# Patient Record
Sex: Male | Born: 1980 | Race: Black or African American | Hispanic: No | Marital: Single | Smoking: Never smoker
Health system: Southern US, Community
[De-identification: ages and names within clinical notes are randomized; demographics above are authoritative.]

---

## 2015-04-15 ENCOUNTER — Emergency Department (HOSPITAL_COMMUNITY)
Admission: EM | Admit: 2015-04-15 | Discharge: 2015-04-15 | Disposition: A | Discharge status: Home / Self Care | Payer: Self-pay | Attending: Emergency Medicine | Admitting: Emergency Medicine

## 2015-04-15 ENCOUNTER — Encounter (HOSPITAL_COMMUNITY): Payer: Self-pay | Admitting: Emergency Medicine

## 2015-04-15 ENCOUNTER — Emergency Department (HOSPITAL_COMMUNITY): Payer: Self-pay

## 2015-04-15 DIAGNOSIS — R42 Dizziness and giddiness: Secondary | ICD-10-CM | POA: Insufficient documentation

## 2015-04-15 DIAGNOSIS — R945 Abnormal results of liver function studies: Secondary | ICD-10-CM

## 2015-04-15 DIAGNOSIS — R1011 Right upper quadrant pain: Secondary | ICD-10-CM | POA: Insufficient documentation

## 2015-04-15 DIAGNOSIS — R7989 Other specified abnormal findings of blood chemistry: Secondary | ICD-10-CM | POA: Insufficient documentation

## 2015-04-15 DIAGNOSIS — R1012 Left upper quadrant pain: Secondary | ICD-10-CM | POA: Insufficient documentation

## 2015-04-15 DIAGNOSIS — J00 Acute nasopharyngitis [common cold]: Secondary | ICD-10-CM | POA: Insufficient documentation

## 2015-04-15 DIAGNOSIS — R197 Diarrhea, unspecified: Secondary | ICD-10-CM | POA: Insufficient documentation

## 2015-04-15 LAB — COMPREHENSIVE METABOLIC PANEL
ALT: 70 U/L — ABNORMAL HIGH (ref 17–63)
ANION GAP: 8 (ref 5–15)
AST: 71 U/L — ABNORMAL HIGH (ref 15–41)
Albumin: 4.3 g/dL (ref 3.5–5.0)
Alkaline Phosphatase: 80 U/L (ref 38–126)
BILIRUBIN TOTAL: 1.1 mg/dL (ref 0.3–1.2)
BUN: 10 mg/dL (ref 6–20)
CO2: 26 mmol/L (ref 22–32)
CREATININE: 0.97 mg/dL (ref 0.61–1.24)
Calcium: 9.2 mg/dL (ref 8.9–10.3)
Chloride: 105 mmol/L (ref 101–111)
GFR calc Af Amer: >60 mL/min (ref >60)
GLUCOSE: 120 mg/dL — AB (ref 65–99)
POTASSIUM: 4.1 mmol/L (ref 3.5–5.1)
Sodium: 139 mmol/L (ref 135–145)
Total Protein: 7.5 g/dL (ref 6.5–8.1)

## 2015-04-15 LAB — URINALYSIS, ROUTINE W REFLEX MICROSCOPIC
Bilirubin Urine: NEGATIVE
GLUCOSE, UA: NEGATIVE mg/dL
Hgb urine dipstick: NEGATIVE
Ketones, ur: NEGATIVE mg/dL
Nitrite: NEGATIVE
PH: 6 (ref 5.0–8.0)
PROTEIN: NEGATIVE mg/dL
SPECIFIC GRAVITY, URINE: 1.028 (ref 1.005–1.030)
Urobilinogen, UA: 0.2 mg/dL (ref 0.0–1.0)

## 2015-04-15 LAB — CBC WITH DIFFERENTIAL/PLATELET
Basophils Absolute: 0 10*3/uL (ref 0.0–0.1)
Basophils Relative: 0 % (ref 0–1)
Eosinophils Absolute: 0.1 10*3/uL (ref 0.0–0.7)
Eosinophils Relative: 1 % (ref 0–5)
HCT: 46.6 % (ref 39.0–52.0)
Hemoglobin: 15.5 g/dL (ref 13.0–17.0)
LYMPHS PCT: 11 % — AB (ref 12–46)
Lymphs Abs: 1.1 10*3/uL (ref 0.7–4.0)
MCH: 28.7 pg (ref 26.0–34.0)
MCHC: 33.3 g/dL (ref 30.0–36.0)
MCV: 86.1 fL (ref 78.0–100.0)
Monocytes Absolute: 1 10*3/uL (ref 0.1–1.0)
Monocytes Relative: 10 % (ref 3–12)
NEUTROS ABS: 8 10*3/uL — AB (ref 1.7–7.7)
Neutrophils Relative %: 78 % — ABNORMAL HIGH (ref 43–77)
Platelets: 280 10*3/uL (ref 150–400)
RBC: 5.41 MIL/uL (ref 4.22–5.81)
RDW: 14 % (ref 11.5–15.5)
WBC: 10.3 10*3/uL (ref 4.0–10.5)

## 2015-04-15 LAB — URINE MICROSCOPIC-ADD ON

## 2015-04-15 LAB — LIPASE, BLOOD: Lipase: 14 U/L — ABNORMAL LOW (ref 22–51)

## 2015-04-15 MED ORDER — PROMETHAZINE HCL 25 MG PO TABS: 25.0000 mg | ORAL_TABLET | Freq: Four times a day (QID) | ORAL | Status: AC | PRN

## 2015-04-15 MED ORDER — FAMOTIDINE 20 MG PO TABS: 20.0000 mg | ORAL_TABLET | Freq: Two times a day (BID) | ORAL | Status: AC

## 2015-04-15 NOTE — ED Notes (Signed)
Patient is alert and oriented x3.  He was given DC instructions and follow up visit instructions.  Patient gave verbal understanding.  He was DC ambulatory under his own power to home.  V/S stable.  He was not showing any signs of distress on DC 

## 2015-04-15 NOTE — ED Notes (Signed)
Ultrasound at bedside

## 2015-04-15 NOTE — Discharge Instructions (Signed)
Medication for stomach irritation and nausea. Your liver tests were slightly elevated. You will need primary care follow-up. Resource guide given.     Emergency Department Resource Guide 1) Find a Doctor and Pay Out of Pocket Although you won't have to find out who is covered by your insurance plan, it is a good idea to ask around and get recommendations. You will then need to call the office and see if the doctor you have chosen will accept you as a new patient and what types of options they offer for patients who are self-pay. Some doctors offer discounts or will set up payment plans for their patients who do not have insurance, but you will need to ask so you aren't surprised when you get to your appointment.  2) Contact Your Local Health Department Not all health departments have doctors that can see patients for sick visits, but many do, so it is worth a call to see if yours does. If you don't know where your local health department is, you can check in your phone book. The CDC also has a tool to help you locate your state's health department, and many state websites also have listings of all of their local health departments.  3) Find a Walk-in Clinic If your illness is not likely to be very severe or complicated, you may want to try a walk in clinic. These are popping up all over the country in pharmacies, drugstores, and shopping centers. They're usually staffed by nurse practitioners or physician assistants that have been trained to treat common illnesses and complaints. They're usually fairly quick and inexpensive. However, if you have serious medical issues or chronic medical problems, these are probably not your best option.  No Primary Care Doctor: - Call Health Connect at  [phone] - they can help you locate a primary care doctor that  accepts your insurance, provides certain services, etc. - Physician Referral Service- [phone]  Chronic Pain Problems: Organization          Address  Phone   Notes  [facility]  [phone] Patients need to be referred by their primary care doctor.   Medication Assistance: Organization         Address  Phone   Notes  [facility] Medication [facility] [address] [place] [place], [place] [address] [phone] --Must be a resident of [facility] -- Must have NO insurance coverage whatsoever (no Medicaid/ Medicare, etc.) -- The pt. MUST have a primary care doctor that directs their care regularly and follows them in the community   [facility]  [phone]   [employer]  [phone]    Agencies that provide inexpensive medical care: Organization         Address  Phone   Notes  [facility]  [phone]   [facility]    [phone]   [facility] [address] [place], [place] [address] [phone]   [facility] [place] [address] [place] [place] [phone]   [facility]    [phone]   [facility]    [phone]   [facility]  [address], [place] Phone:  [phone], Fax:  [phone] Hours of Operation:  9 am - 6 pm, M-F.  Also accepts Medicaid/Medicare and self-pay.  [facility]  [address], [place] Phone: [phone])  [phone], Fax: [phone]. Hours of Operation:  8:30 am - 5:30 pm, M-F.  Also accepts Medicaid and self-pay.  [facility] [place] [address], [place] Phone: [phone]   [place], [place], [place] [phone], Ext. [phone] Mondays & Thursdays: 7-9 AM.  First 15 patients are seen on a first come, first serve basis.    [place] Providers:  Organization         Address  Phone   Notes  [facility] [address], [place] [phone] Also accepts self-pay patients.  [facility] [address], [place]  [phone]   [place], [address], [place] [phone]   [facility] [address] [place] [phone]   Lucianne Lei [address], [place]   [phone] Only accepts [place] Access [place] patients after they have their name applied to their card.   Self-Pay (no insurance) in [facility]:  Organization         Address  Phone   Notes  Sickle Cell Patients, [facility] [place] [phone]   [facility] [place] [phone]   [facility] [place]  [place], [place], [place] [phone]   [facility]/Dr. Osei-Bonsu  [address] [place] or [address], [place] [phone] Phone number for both [place] and [place] locations is the same.  [facility] [address] [place] [phone]   [facility] [address], [place] or [address] [phone]   [facility] [address] [place] [phone], phone; [phone], fax Sees patients 1st and 3rd Saturday of every month.  Must not qualify for public or private insurance (i.e. Medicaid, Medicare, [place] Health Choice, Veterans' Benefits)  Household income should be no more than 200% of the poverty level The clinic cannot treat you if you are pregnant or think you are pregnant  Sexually transmitted diseases are not treated at the clinic.    Dental Care: Organization         Address  Phone  Notes  [facility] [place] [phone] Accepts children up to age 23 who are enrolled in [place] or [place]; pregnant women with a Medicaid card; and  children who have applied for Medicaid or [place] Health Choice, but were declined, whose parents can pay a reduced fee at time of service.  [facility]  [address], [place] [phone] Accepts children up to age 22 who are enrolled in [place] or [place]; pregnant women with a Medicaid card; and children who have applied for Medicaid or [place] Health Choice, but were declined, whose parents can pay a reduced fee at time of service.  [facility]  [place] [phone] Patients are seen by appointment only. Walk-ins are not accepted. [place] will see patients 70 years of age and older. Monday - Tuesday (8am-5pm) Most Wednesdays (8:30-5pm) $30 per visit, cash only  [facility]  [address], [place] [phone] Patients are seen by appointment only. Walk-ins are not accepted. [facility] will see patients 18 years of age and older. °One Wednesday Evening (Monthly: Volunteer Based).  $30 per visit, cash only  °[facility]  [phone] for adults; Children under age 4, call Graduate Pediatric Dentistry at [phone]. Children aged 4-14, please call [phone] to request a pediatric application. ° Dental services are provided in all areas of dental care including fillings, crowns and bridges, complete and partial dentures, implants, gum treatment, root canals, and extractions. Preventive care is also provided. Treatment is provided to both adults and children. °Patients are selected via a lottery and there is often a waiting list. °  °[facility] [address], °[place] ° ([phone]) [phone] [email or web address] °  °[facility] [address], [place], [place] ([phone], Ext. [phone] Second and Fourth Thursday of each month, opens at 6:30 AM; Clinic ends at 9 AM.  Patients are seen on a first-come first-served  basis, and a limited number are seen during each clinic.  ° °[facility] ° [address], [place], [place] [phone]) [phone]   Eligibility Requirements °You must have lived in [place], [place], or [place] counties for at least the last three months. °  You cannot be eligible for state or federal sponsored healthcare insurance, including Veterans Administration, Medicaid, or Medicare. °  You generally cannot be eligible for healthcare insurance through your employer.  °  How to apply: °Eligibility screenings are held every Tuesday and Wednesday afternoon from 1:00 pm until 4:00 pm. You do not need an appointment for the interview!  °[facility] [address], [place], [place] [phone]   °[facility]  336-342-8273   °[facility]  [phone]   °[facility]  336-570-6415   ° °Behavioral Health Resources in the Community: °Intensive Outpatient Programs °Organization         Address  Phone  Notes  °[facility] [address], [place], [place] [phone]   °[facility] [address], [place], [place] 336-832-9800   °[facility] [address], [place], [place] ° 336-882-2125   °[facility] [address],  °[place], [place] 1-800-853-5163 or 336-641-4981   °Substance Abuse Resources °Organization         Address  Phone  Notes  °[facility]  336-882-2125   °[facility]  336-784-9470   °[facility]  336-285-9073   °[facility]  336-845-3988   °Residential & Outpatient Substance Abuse Program  1-800-659-3381   °Psychological Services °Organization         Address  Phone  Notes  °[facility]  336- 832-9600   °[facility]  336- 378-7881   °[facility] [address], [place] 1-800-853-5163 or 336-641-4981   ° °Mobile Crisis Teams °Organization          Address  Phone  Notes  °[facility], Mobile Crisis Care Unit  1-877-626-1772   °Assertive °[facility] ° [address] [place], [place] 336-834-9664   °Sharon DeEsch [address] °[place] 336-554-5454   ° °Self-Help/Support Groups °Organization         Address  Phone             Notes  °[facility] [place] - variety of support groups    336- 373-1402 Call for more information  °Narcotics Anonymous (NA), [facility] [address], °[place]  2 meetings at this location  ° °Residential Treatment Programs °Organization         Address  Phone  Notes  °[facility] [address],    °[place]  1-866-801-8205   °[facility] ° [address], [place], [place] [phone]   °[facility] [address], [place] 336-845-3988 Admissions: 8am-3pm M-F  °[facility] [address]    °[place], [place] 336-841-1104   °[facility] [address], [place], [place] 336-379-7146   °[facility] [address]  °[place], [place] 336-285-9073   °[facility] - Intensive Outpatient [address], [place], [place] 336-852-3033   °[facility] [address]  °[place], [place] 1-877-615-2722 or 336-784-9470   °[facility] ([facility]) [address] [place], [place] 336-227-7417 Accepts Medicaid  °[facility] [address]  °[place] 1-800-659-3381 Substance Abuse/Addiction Treatment  ° °[place] [facility] Resources °Organization         Address  Phone  Notes  °[facility]  (888) 581-9988   °Julie Brannon, PhD [address] [place], [place]   (336) 349-5553 or (336) 951-0000   °[facility]   [address] °[place], [place] (336) 349-4454   °[facility] [address], [place], [place] (336) 342-8316 Insurance/Medicaid/sponsorship  through [facility]  °[facility] [address]                                    [place], [place] (336) [phone] Therapy/tele-psych/case  °[facility] [address]  ° [place], [place] (336) 349-2233    °Dr. Arfeen  (336) 349-4544   °[facility] [place]  [employer] [facility] 1) [address], [place] °2) [address], [place] °3)  [address], [place] (336) 349-3220 °(336) 342-7768 ° °(336) 342-8140   °[place] Child Abuse [facility]line (336) 342-1394 or (336) 342-3537 (After Hours)    ° ° °

## 2015-04-15 NOTE — ED Provider Notes (Signed)
CSN: [id number]     Arrival date & time 04/15/15  1335 History   First MD Initiated Contact with Patient 04/15/15 1506     Chief Complaint  Patient presents with  . Abdominal Pain     (Consider location/radiation/quality/duration/timing/severity/associated sxs/prior Treatment) HPI..... Upper abdominal pain sent approximately 11 PM last night after eating meatballs and pasta with associated watery diarrhea 2. He felt shaky and lightheaded and cold. He went to urgent care center today but left prior to full workup. He is normally healthy. No medications.  No surgery. Minimal alcohol consumption.  He is feeling better now. Severity is mild to moderate. Nothing makes symptoms better or worse.  History reviewed. No pertinent past medical history. History reviewed. No pertinent past surgical history. History reviewed. No pertinent family history. History  Substance Use Topics  . Smoking status: Never Smoker   . Smokeless tobacco: Not on file  . Alcohol Use: No    Review of Systems  All other systems reviewed and are negative.     Allergies  Review of patient's allergies indicates no known allergies.  Home Medications   Prior to Admission medications   Medication Sig Start Date End Date Taking? Authorizing Provider  famotidine (PEPCID) 20 MG tablet Take 1 tablet (20 mg total) by mouth 2 (two) times daily. 04/15/15   Donnetta Hutching, MD  promethazine (PHENERGAN) 25 MG tablet Take 1 tablet (25 mg total) by mouth every 6 (six) hours as needed for nausea or vomiting. 04/15/15   Donnetta Hutching, MD   BP 131/66 mmHg  Pulse 72  Temp(Src) 98 F (36.7 C) (Oral)  Resp 18  SpO2 100% Physical Exam  Constitutional: He is oriented to person, place, and time. He appears well-developed and well-nourished.  HENT:  Head: Normocephalic and atraumatic.  Eyes: Conjunctivae and EOM are normal. Pupils are equal, round, and reactive to light.  Neck: Normal range of motion. Neck supple.  Cardiovascular:  Normal rate and regular rhythm.   Pulmonary/Chest: Effort normal and breath sounds normal.  Abdominal: Soft. Bowel sounds are normal.  Minimal upper abdominal tenderness right upper quadrant greater than left upper quadrant  Musculoskeletal: Normal range of motion.  Neurological: He is alert and oriented to person, place, and time.  Skin: Skin is warm and dry.  Psychiatric: He has a normal mood and affect. His behavior is normal.  Nursing note and vitals reviewed.   ED Course  Procedures (including critical care time) Labs Review Labs Reviewed  CBC WITH DIFFERENTIAL/PLATELET - Abnormal; Notable for the following:    Neutrophils Relative % 78 (*)    Neutro Abs 8.0 (*)    Lymphocytes Relative 11 (*)    All other components within normal limits  COMPREHENSIVE METABOLIC PANEL - Abnormal; Notable for the following:    Glucose, Bld 120 (*)    AST 71 (*)    ALT 70 (*)    All other components within normal limits  LIPASE, BLOOD - Abnormal; Notable for the following:    Lipase 14 (*)    All other components within normal limits  URINALYSIS, ROUTINE W REFLEX MICROSCOPIC (NOT AT [facility]) - Abnormal; Notable for the following:    Color, Urine AMBER (*)    Leukocytes, UA SMALL (*)    All other components within normal limits  URINE MICROSCOPIC-ADD ON - Abnormal; Notable for the following:    Casts HYALINE CASTS (*)    All other components within normal limits    Imaging Review US Abdomen Limited  Ruq  04/15/2015   CLINICAL DATA:  Abdominal pain since 11 pm, elevated LFTs  EXAM: US ABDOMEN LIMITED - RIGHT UPPER QUADRANT  COMPARISON:  None.  FINDINGS: Gallbladder:  No gallstones, gallbladder wall thickening, or pericholecystic fluid. Negative sonographic Murphy's sign.  Common bile duct:  Diameter: 4 mm  Liver:  6 x 7 x 8 mm echogenic lesion in the right hepatic lobe, likely reflecting a benign hemangioma. Within normal limits for parenchymal echogenicity.  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  Otherwise negative right upper quadrant ultrasound.   Electronically Signed   By: Charline Bills M.D.   On: 04/15/2015 18:37     EKG Interpretation None      MDM   Final diagnoses:  RUQ pain  Elevated liver function tests    No acute abdomen. Discussed elevated liver functions with patient and his significant other. Also discussed ultrasound results. Discharge medications Phenergan 25 mg and Pepcid. He will get primary care follow-up.    Donnetta Hutching, MD 04/15/15 2134

## 2015-04-15 NOTE — ED Notes (Signed)
Pt c/o mid abd pain since yesterday. Denies NVD.

## 2016-08-04 ENCOUNTER — Encounter (HOSPITAL_COMMUNITY): Payer: Self-pay

## 2016-08-04 ENCOUNTER — Ambulatory Visit (HOSPITAL_COMMUNITY)
Admission: EM | Admit: 2016-08-04 | Discharge: 2016-08-04 | Disposition: A | Discharge status: Home / Self Care | Payer: Self-pay | Attending: Internal Medicine | Admitting: Internal Medicine

## 2016-08-04 DIAGNOSIS — H1033 Unspecified acute conjunctivitis, bilateral: Secondary | ICD-10-CM

## 2016-08-04 MED ORDER — GENTAMICIN SULFATE 0.3 % OP SOLN: 2.0000 [drp] | OPHTHALMIC | 0 refills | Status: DC

## 2016-08-04 NOTE — ED Provider Notes (Signed)
CSN: [id number]     Arrival date & time 08/04/16  1824 History   First MD Initiated Contact with Patient 08/04/16 1921     Chief Complaint  Patient presents with  . Conjunctivitis   (Consider location/radiation/quality/duration/timing/severity/associated sxs/prior Treatment) HPI NP 35 Y/O MALE WITH DRAINING, RED, ITCHY EYES FOR 4 DAYS. NOT GETTING BETTER.TX AT HOME WITH APPLE VINEGAR. PAIN SCORE 2 History reviewed. No pertinent past medical history. History reviewed. No pertinent surgical history. History reviewed. No pertinent family history. Social History  Substance Use Topics  . Smoking status: Never Smoker  . Smokeless tobacco: Never Used  . Alcohol use No    Review of Systems  Denies: HEADACHE, NAUSEA, ABDOMINAL PAIN, CHEST PAIN, CONGESTION, DYSURIA, SHORTNESS OF BREATH  Allergies  Review of patient's allergies indicates no known allergies.  Home Medications   Prior to Admission medications   Medication Sig Start Date End Date Taking? Authorizing Provider  famotidine (PEPCID) 20 MG tablet Take 1 tablet (20 mg total) by mouth 2 (two) times daily. 04/15/15   Donnetta HutchingBrian Cook, MD  gentamicin (GARAMYCIN) 0.3 % ophthalmic solution Place 2 drops into both eyes every 4 (four) hours. 08/04/16 08/11/16  Tharon AquasFrank C Patrick, PA  promethazine (PHENERGAN) 25 MG tablet Take 1 tablet (25 mg total) by mouth every 6 (six) hours as needed for nausea or vomiting. 04/15/15   Donnetta HutchingBrian Cook, MD   Meds Ordered and Administered this Visit  Medications - No data to display  BP 140/99   Pulse 77   Temp 98.9 F (37.2 C) (Oral)   Resp 12   SpO2 100%  No data found.   Physical Exam NURSES NOTES AND VITAL SIGNS REVIEWED. CONSTITUTIONAL: Well developed, well nourished, no acute distress HEENT: normocephalic, atraumatic EYES: Conjunctiva RED AND INJECTED. DRAINAGE IN LASHES. PERIAURICULAR NODES PALPABLE.  NECK:normal ROM, supple, no adenopathy PULMONARY:No respiratory distress, normal  effort ABDOMINAL: Soft, ND, NT BS+, No CVAT MUSCULOSKELETAL: Normal ROM of all extremities,  SKIN: warm and dry without rash PSYCHIATRIC: Mood and affect, behavior are normal  Urgent Care Course   Clinical Course    Procedures (including critical care time)  Labs Review Labs Reviewed - No data to display  Imaging Review No results found.   Visual Acuity Review  Right Eye Distance: 20/25 Left Eye Distance: 20/20 Bilateral Distance:    Right Eye Near:   Left Eye Near:    Bilateral Near:         MDM   1. Acute bacterial conjunctivitis of both eyes     Patient is reassured that there are no issues that require transfer to higher level of care at this time or additional tests. Patient is advised to continue home symptomatic treatment. Patient is advised that if there are new or worsening symptoms to attend the emergency department, contact primary care provider, or return to UC. Instructions of care provided discharged home in stable condition.    THIS NOTE WAS GENERATED USING A VOICE RECOGNITION SOFTWARE PROGRAM. ALL REASONABLE EFFORTS  WERE MADE TO PROOFREAD THIS DOCUMENT FOR ACCURACY.  I have verbally reviewed the discharge instructions with the patient. A printed AVS was given to the patient.  All questions were answered prior to discharge.      Tharon AquasFrank C Patrick, PA 08/04/16 2050    Tharon AquasFrank C Patrick, [place] 08/04/16 2051

## 2016-08-04 NOTE — ED Triage Notes (Signed)
Pt here for bilateral pink eye started last Tuesday and progressed to both eyes. Complains of sensitivity to light, itching, watery eyes. Has been using OTC pink eye relief drops without improvement

## 2016-08-06 ENCOUNTER — Telehealth (HOSPITAL_COMMUNITY): Payer: Self-pay | Admitting: Emergency Medicine

## 2016-08-06 ENCOUNTER — Telehealth (HOSPITAL_COMMUNITY): Payer: Self-pay | Admitting: *Deleted

## 2016-08-06 NOTE — Telephone Encounter (Signed)
Rec'd message from front staff stating that pt is still not getting any better... Seen here on 10/14 for conjunctivitis... Taking meds w/no relief and would like to know what to do.   Called pt at [phone] and pt's fiancee picked up.   States she called on his behalf... Adv her that I need to speak to him or have consent from him to speak to her  Steffanie RainwaterFiancee verb understanding and will have pt call us.

## 2016-08-10 ENCOUNTER — Ambulatory Visit (HOSPITAL_COMMUNITY)
Admission: EM | Admit: 2016-08-10 | Discharge: 2016-08-10 | Disposition: A | Discharge status: Home / Self Care | Payer: Self-pay | Attending: Family Medicine | Admitting: Family Medicine

## 2016-08-10 ENCOUNTER — Encounter (HOSPITAL_COMMUNITY): Payer: Self-pay | Admitting: Emergency Medicine

## 2016-08-10 DIAGNOSIS — H10213 Acute toxic conjunctivitis, bilateral: Secondary | ICD-10-CM

## 2016-08-10 MED ORDER — TOBRAMYCIN-DEXAMETHASONE 0.3-0.1 % OP SUSP: 1.0000 [drp] | OPHTHALMIC | 0 refills | Status: AC

## 2016-08-10 NOTE — ED Provider Notes (Signed)
[facility]    CSN: [id number] Arrival date & time: 08/10/16  1124     History   Chief Complaint Chief Complaint  Patient presents with  . Conjunctivitis    HPI Miguel SellersJoe Wells is a 35 y.o. male.   This is a 35 year old man who comes in with bilateral eye injection unresponsive to gentamicin ophthalmic solution.  The patient works in 2 different jobs. 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 In the other job at [employer], patient hopes people with their mechanical problems including battery issues. He recalls helping some with a battery about a week ago and might of gotten some battery liquid in his eye.  The problem began in his left eye and now is in his right eye. His girlfriend also had some problems with her left eye but they thought it was mascara.  Patient's had swollen glands bilaterally anterior to the tragus of his ears.      History reviewed. No pertinent past medical history.  There are no active problems to display for this patient.   History reviewed. No pertinent surgical history.     Home Medications    Prior to Admission medications   Medication Sig Start Date End Date Taking? Authorizing Provider  gentamicin (GARAMYCIN) 0.3 % ophthalmic solution Place 2 drops into both eyes every 4 (four) hours. 08/04/16 08/11/16 Yes Tharon AquasFrank C Patrick, PA  famotidine (PEPCID) 20 MG tablet Take 1 tablet (20 mg total) by mouth 2 (two) times daily. 04/15/15   Donnetta HutchingBrian Cook, MD  promethazine (PHENERGAN) 25 MG tablet Take 1 tablet (25 mg total) by mouth every 6 (six) hours as needed for nausea or vomiting. 04/15/15   Donnetta HutchingBrian Cook, MD    Family History History reviewed. No pertinent family history.  Social History Social History  Substance Use Topics  . Smoking status: Never Smoker  . Smokeless tobacco: Never Used  . Alcohol use No     Allergies   Review of patient's allergies indicates no known allergies.   Review of Systems Review of Systems  Constitutional:  Negative.   HENT: Negative.   Eyes: Positive for photophobia, pain, discharge, redness and itching. Negative for visual disturbance.  Musculoskeletal: Negative.      Physical Exam Triage Vital Signs ED Triage Vitals  Enc Vitals Group     BP 08/10/16 1153 151/76     Pulse Rate 08/10/16 1153 81     Resp 08/10/16 1153 14     Temp 08/10/16 1153 98.3 F (36.8 C)     Temp Source 08/10/16 1153 Oral     SpO2 08/10/16 1153 100 %     Weight --      Height --      Head Circumference --      Peak Flow --      Pain Score 08/10/16 1203 3     Pain Loc --      Pain Edu? --      Excl. in GC? --    No data found.   Updated Vital Signs BP 151/76 (BP Location: Left Arm)   Pulse 81   Temp 98.3 F (36.8 C) (Oral)   Resp 14   SpO2 100%      Physical Exam  Constitutional: He appears well-developed and well-nourished.  HENT:  Head: Normocephalic.  Right Ear: External ear normal.  Left Ear: External ear normal.  Mouth/Throat: Oropharynx is clear and moist.  Eyes: Pupils are equal, round, and reactive to light. Right eye  exhibits no discharge. Left eye exhibits no discharge.    Of the conjunctiva are intensely red and the lids are mildly swollen. Pupils are equal and reactive to light. Fundi are normal.  Pulmonary/Chest: Effort normal.  Musculoskeletal: Normal range of motion.  Nursing note and vitals reviewed.    UC Treatments / Results  Labs (all labs ordered are listed, but only abnormal results are displayed) Labs Reviewed - No data to display  EKG  EKG Interpretation None       Radiology No results found.  Procedures Procedures (including critical care time)  Medications Ordered in UC Medications - No data to display   Initial Impression / Assessment and Plan / UC Course  I have reviewed the triage vital signs and the nursing notes.  Pertinent labs & imaging results that were available during my care of the patient were reviewed by me and considered in my  medical decision making (see chart for details).  Clinical Course    Final Clinical Impressions(s) / UC Diagnoses   Final diagnoses:  None    New Prescriptions New Prescriptions   No medications on file     Elvina Sidle, MD 08/10/16 1225

## 2016-08-10 NOTE — ED Triage Notes (Signed)
Here for persistent bilateral pink eye   Seen here on 10/14 and was given Gentamycin eye drops w/no relief.   Sx today include: irritation, redness, watery, blurred vision  Denies inj/trauma  A&O x4... NAD

## 2016-08-10 NOTE — Discharge Instructions (Signed)
Wash your pillowcase.  If you're not better by Monday, I want you to see Dr. Ernesto Rutherfordobert [address]at [address] across from the hospital parking deck

## 2018-05-20 ENCOUNTER — Encounter (HOSPITAL_COMMUNITY): Payer: Self-pay

## 2018-05-20 ENCOUNTER — Ambulatory Visit (INDEPENDENT_AMBULATORY_CARE_PROVIDER_SITE_OTHER): Payer: BLUE CROSS/BLUE SHIELD

## 2018-05-20 ENCOUNTER — Ambulatory Visit (HOSPITAL_COMMUNITY)
Admission: EM | Admit: 2018-05-20 | Discharge: 2018-05-20 | Disposition: A | Discharge status: Home / Self Care | Payer: BLUE CROSS/BLUE SHIELD | Attending: Internal Medicine | Admitting: Internal Medicine

## 2018-05-20 DIAGNOSIS — M545 Low back pain, unspecified: Secondary | ICD-10-CM

## 2018-05-20 MED ORDER — MELOXICAM 7.5 MG PO TABS: 7.5000 mg | ORAL_TABLET | Freq: Every day | ORAL | 0 refills | Status: AC

## 2018-05-20 NOTE — ED Triage Notes (Signed)
Pt presents with lower back pain. 

## 2018-05-20 NOTE — Discharge Instructions (Addendum)
It was nice meeting you!!  Your xray was negative for fracture or anything worrisome.  We will try a round of anti inflammatories for the back pain.  If you are not seeing any improvement in the next few weeks you may want to follow up with an orthopedic.  You may benefit from a steroid injection.  If your not getting any relief from the 7.5 mg once a day, you can take it twice a day.

## 2018-05-20 NOTE — ED Provider Notes (Signed)
[facility]    CSN: [id number] Arrival date & time: 05/20/18  1135     History   Chief Complaint Chief Complaint  Patient presents with  . Back Pain    HPI Miguel Wells is a healthy [age] y.o. male.    Back Pain  Location:  Lumbar spine Quality:  Aching and stiffness Stiffness is present:  All day (worse with standing, bending and stooping. ) Radiates to:  Does not radiate Pain severity:  Moderate Pain is:  Worse during the night Onset quality:  Gradual Duration:  2 weeks Timing:  Constant Progression:  Worsening Chronicity:  New Context: not falling, not jumping from heights, not lifting heavy objects, not MCA, not MVA, not occupational injury, not pedestrian accident, not physical stress, not recent illness, not recent injury and not twisting   Relieved by:  Nothing Worsened by:  Standing, lying down, bending and twisting Ineffective treatments:  Ibuprofen Associated symptoms: no abdominal pain, no abdominal swelling, no bladder incontinence, no bowel incontinence, no chest pain, no dysuria, no fever, no headaches, no leg pain, no numbness, no paresthesias, no pelvic pain, no perianal numbness, no tingling, no weakness and no weight loss   Risk factors: no hx of cancer, no hx of osteoporosis, no lack of exercise, not obese, no recent surgery, no steroid use and no vascular disease     Pt works at [employer] and has a very physical job. He is concerned because the pain is not getting better.   ROS per HPI   History reviewed. No pertinent past medical history.  There are no active problems to display for this patient.   History reviewed. No pertinent surgical history.     Home Medications    Prior to Admission medications   Medication Sig Start Date End Date Taking? Authorizing Provider  famotidine (PEPCID) 20 MG tablet Take 1 tablet (20 mg total) by mouth 2 (two) times daily. 04/15/15   Donnetta Hutchingook, Brian, MD  meloxicam (MOBIC) 7.5 MG tablet Take 1 tablet (7.5 mg  total) by mouth daily. 05/20/18   Dahlia ByesBast, Delailah Spieth A, NP  promethazine (PHENERGAN) 25 MG tablet Take 1 tablet (25 mg total) by mouth every 6 (six) hours as needed for nausea or vomiting. 04/15/15   Donnetta Hutchingook, Brian, MD  tobramycin-dexamethasone [facility](TOBRADEX) ophthalmic solution Place 1 drop into both eyes every 4 (four) hours while awake. 08/10/16   Elvina SidleLauenstein, Kurt, MD    Family History History reviewed. No pertinent family history.  Social History Social History   Tobacco Use  . Smoking status: Never Smoker  . Smokeless tobacco: Never Used  Substance Use Topics  . Alcohol use: No  . Drug use: No     Allergies   Patient has no known allergies.   Review of Systems Review of Systems  Constitutional: Negative for fever and weight loss.  Cardiovascular: Negative for chest pain.  Gastrointestinal: Negative for abdominal pain and bowel incontinence.  Genitourinary: Negative for bladder incontinence, dysuria and pelvic pain.  Musculoskeletal: Positive for back pain.  Neurological: Negative for tingling, weakness, numbness, headaches and paresthesias.     Physical Exam   Physical Exam  Constitutional: He is oriented to person, place, and time. He appears well-developed and well-nourished.  HENT:  Head: Normocephalic and atraumatic.  Neck: Normal range of motion.  Cardiovascular: Normal rate and regular rhythm.  Pulmonary/Chest: Effort normal and breath sounds normal.  Abdominal: Soft.  Musculoskeletal:  Mild tenderness to lower lumbar spine.  No erythema, ecchymosis, swelling, deformity.  Neurological: He is alert and oriented to person, place, and time.  Skin: Skin is warm and dry.  Psychiatric: He has a normal mood and affect.  Nursing note and vitals reviewed.       Triage Vital Signs ED Triage Vitals  Enc Vitals Group     BP 05/20/18 1156 (!) 146/100     Pulse Rate 05/20/18 1156 77     Resp 05/20/18 1156 20     Temp 05/20/18 1156 98.2 F (36.8 C)     Temp Source  05/20/18 1156 Oral     SpO2 05/20/18 1156 96 %     Weight --      Height --      Head Circumference --      Peak Flow --      Pain Score 05/20/18 1154 7     Pain Loc --      Pain Edu? --      Excl. in GC? --    No data found.  Updated Vital Signs BP (!) 146/100 (BP Location: Left Arm)   Pulse 77   Temp 98.2 F (36.8 C) (Oral)   Resp 20   SpO2 96%   Visual Acuity Right Eye Distance:   Left Eye Distance:   Bilateral Distance:    Right Eye Near:   Left Eye Near:    Bilateral Near:       UC Treatments / Results  Labs (all labs ordered are listed, but only abnormal results are displayed) Labs Reviewed - No data to display  EKG None  Radiology Dg Lumbar Spine Complete  Result Date: 05/20/2018 CLINICAL DATA:  Two weeks of low back pain. The patient does considerable bending in his work. No history of injury. EXAM: LUMBAR SPINE - COMPLETE 4+ VIEW COMPARISON:  None in PACs FINDINGS: The lumbar vertebral bodies are preserved in height. There is minimal narrowing of the L4-5 disc space. Elsewhere the disc space heights are well maintained. There is no spondylolisthesis. The facet joints are unremarkable. The spinous processes are intact. The pedicles and transverse processes are intact. The observed portions of the sacrum are normal. IMPRESSION: Minimal narrowing of the L4-5 disc space. No acute bony abnormality of the lumbar spine. Electronically Signed   By: David  [place] M.D.   On: 05/20/2018 13:09    Procedures Procedures (including critical care time)  Medications Ordered in UC Medications - No data to display  Initial Impression / Assessment and Plan / UC Course  I have reviewed the triage vital signs and the nursing notes.  Pertinent labs & imaging results that were available during my care of the patient were reviewed by me and considered in my medical decision making (see chart for details).     We will get a lumbar spine x-ray due to duration of symptoms and  increasing of pain.   Final Clinical Impressions(s) / UC Diagnoses   Final diagnoses:  Acute midline low back pain without sciatica     Discharge Instructions     It was nice meeting you!!  Your xray was negative for fracture or anything worrisome.  We will try a round of anti inflammatories for the back pain.  If you are not seeing any improvement in the next few weeks you may want to follow up with an orthopedic.  You may benefit from a steroid injection.  If your not getting any relief from the 7.5 mg once a day, you can take it twice a day.  ED Prescriptions    Medication Sig Dispense Auth. Provider   meloxicam (MOBIC) 7.5 MG tablet Take 1 tablet (7.5 mg total) by mouth daily. 30 tablet Dahlia Byes A, NP     Controlled Substance Prescriptions [place] Controlled Substance Registry consulted? Not Applicable  Janace Aris, NP 05/20/18 1502

## 2019-09-02 IMAGING — DX DG LUMBAR SPINE COMPLETE 4+V
5 series · 5 of 5 positions shown · non-contrast
Comparison: None in PACs

CLINICAL DATA: Two weeks of low back pain. The patient does
considerable bending in his work. No history of injury.

EXAM:
LUMBAR SPINE - COMPLETE 4+ VIEW

[l-spine ap]
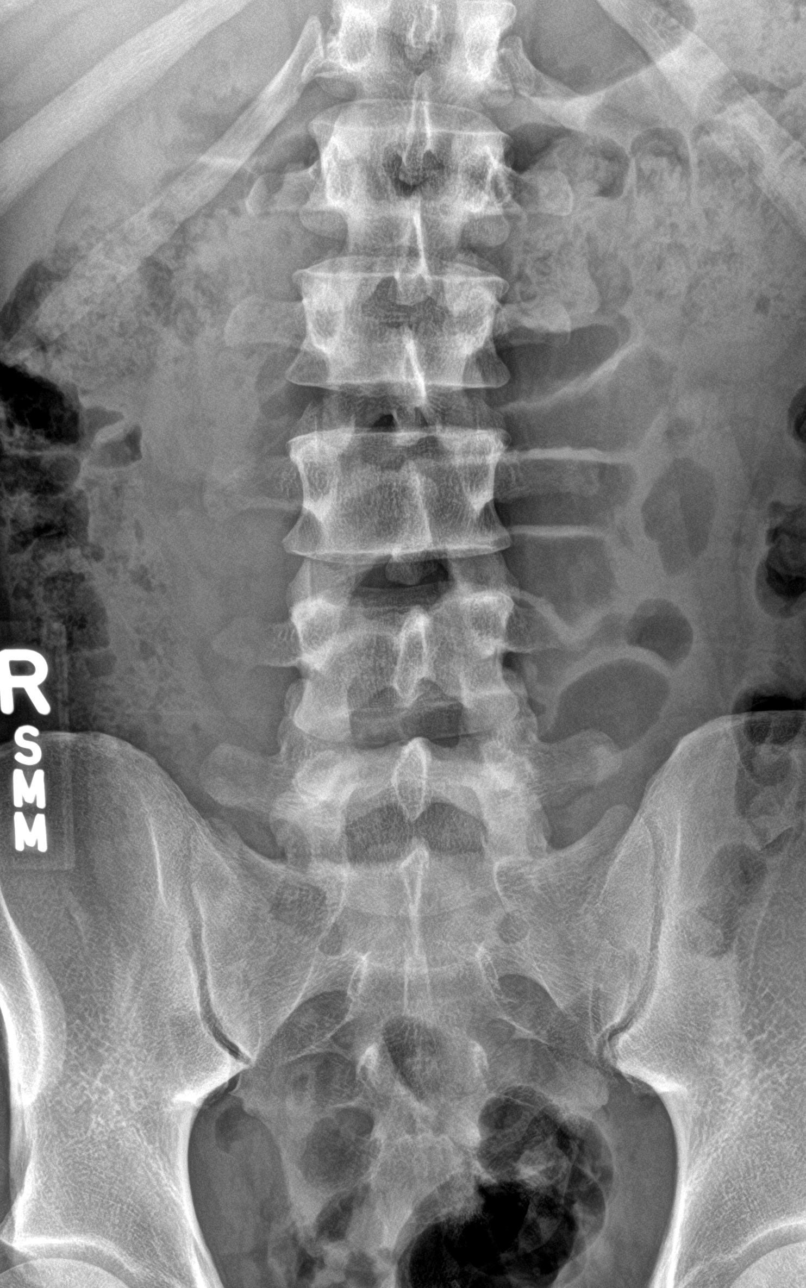

[l-spine obl (1 of 2)]
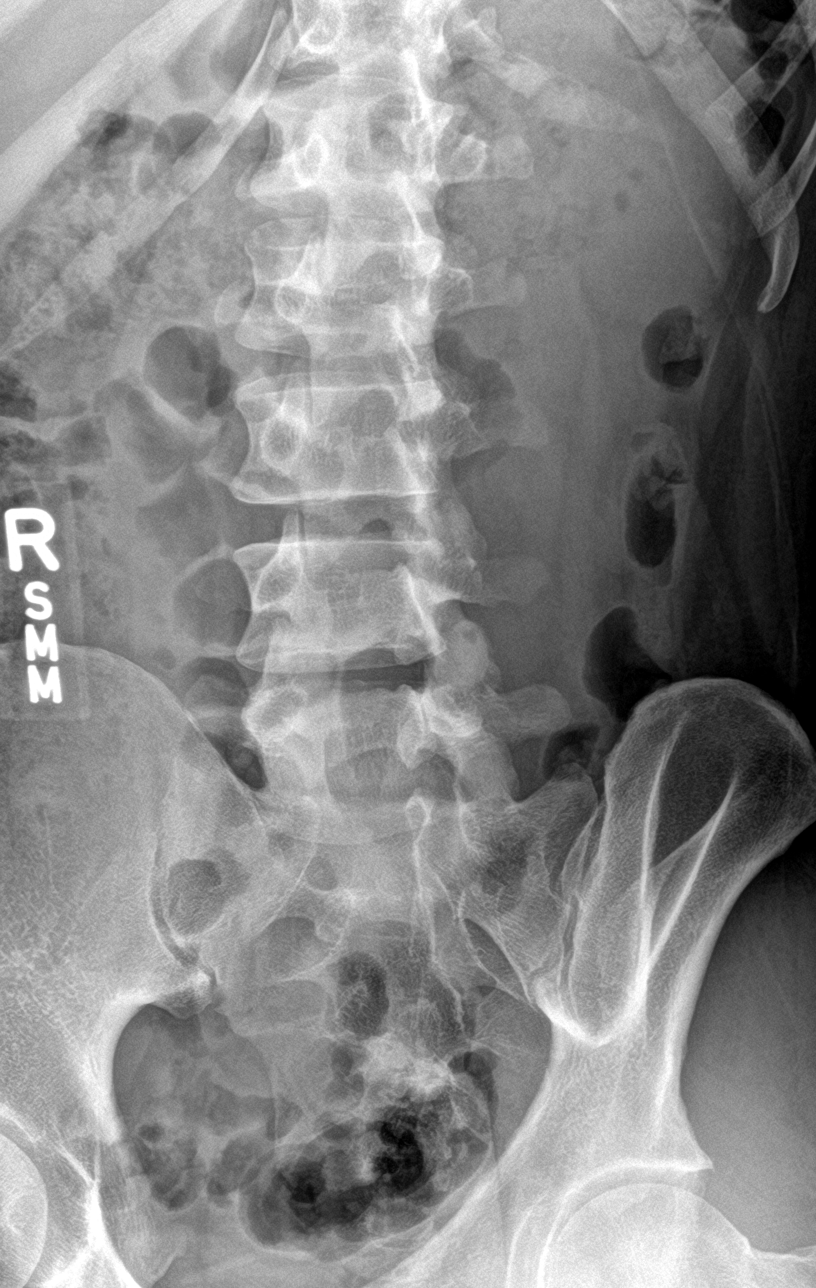

[l-spine obl (2 of 2)]
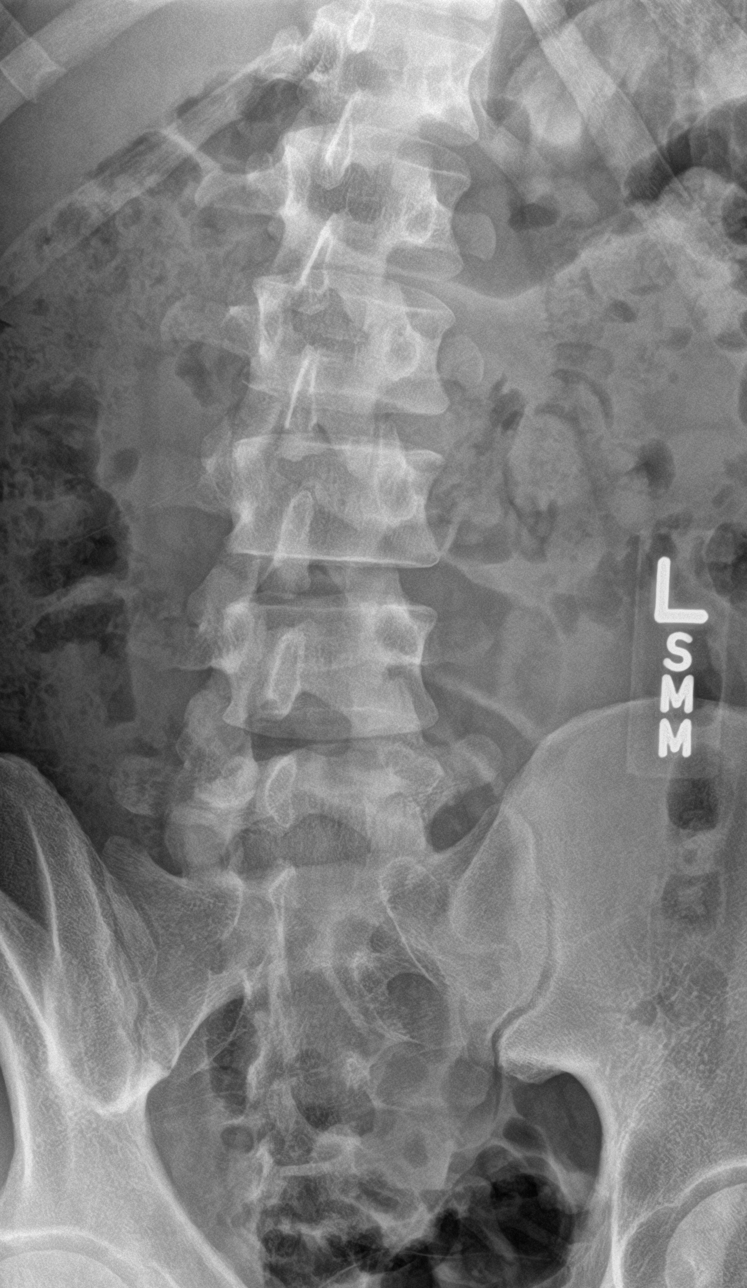

[l-spine lat]
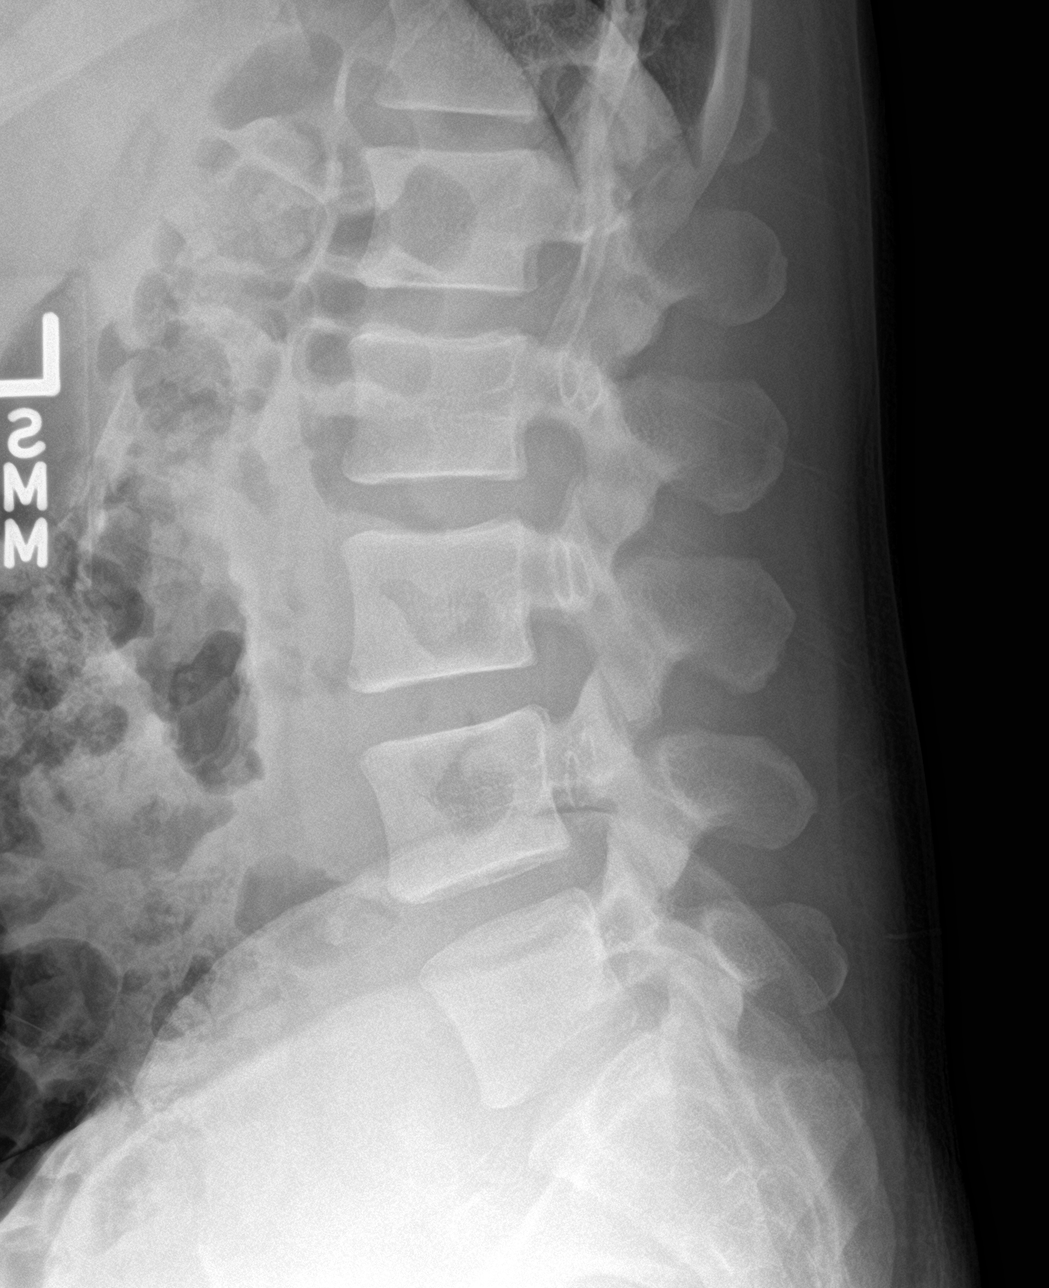

[l-spine spot]
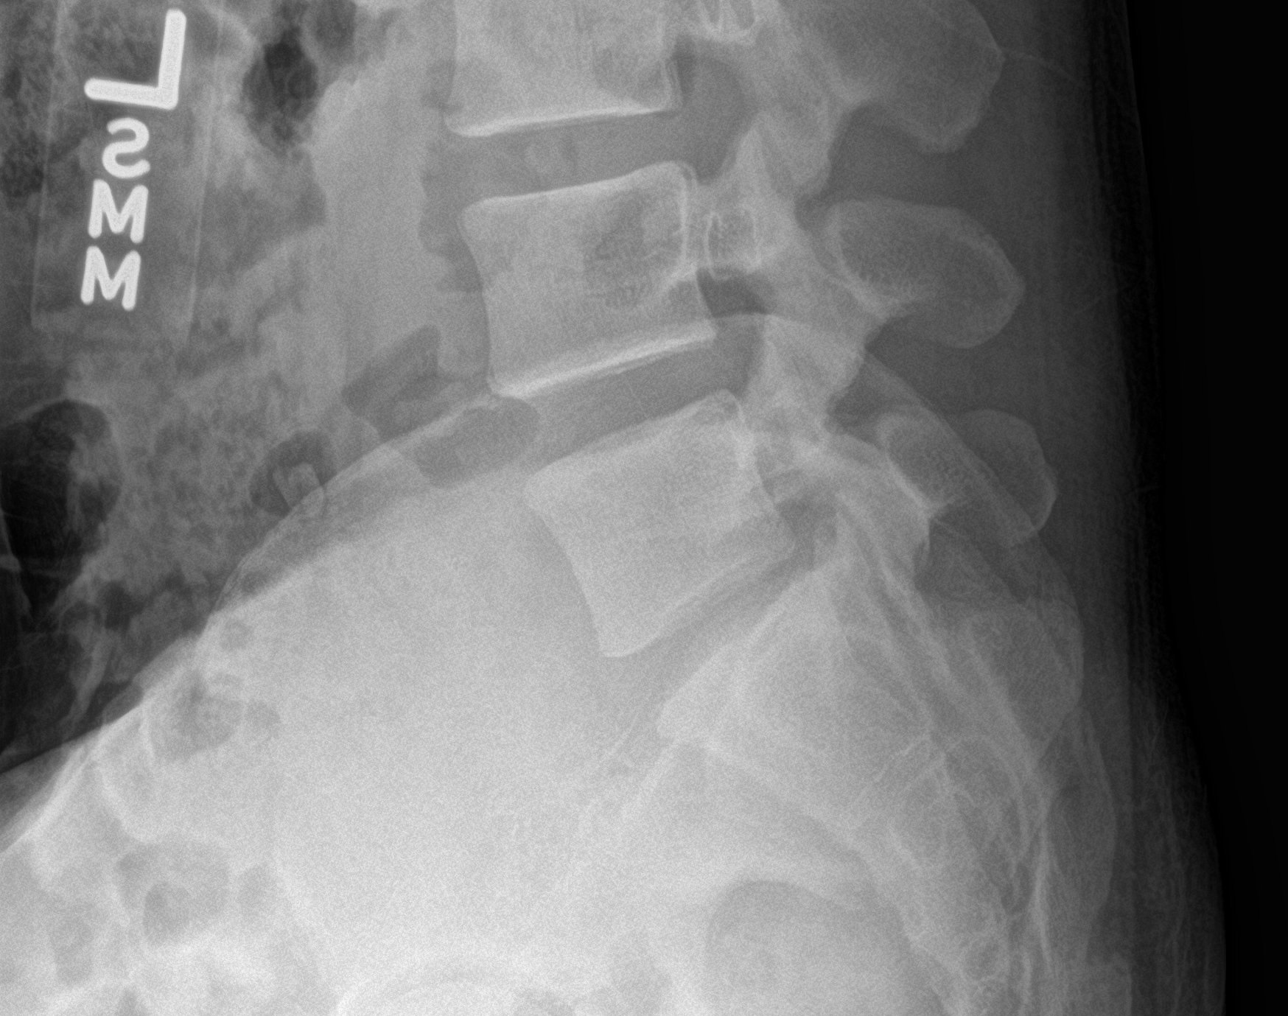

[5 of 5 positions shown; findings below may reference images not displayed]

FINDINGS: The lumbar vertebral bodies are preserved in height. There is
minimal narrowing of the L4-5 disc space. Elsewhere the disc space
heights are well maintained. There is no spondylolisthesis. The
facet joints are unremarkable. The spinous processes are intact. The
pedicles and transverse processes are intact. The observed portions
of the sacrum are normal.
IMPRESSION: Minimal narrowing of the L4-5 disc space. No acute bony abnormality
of the lumbar spine.

## 2021-05-17 ENCOUNTER — Ambulatory Visit (HOSPITAL_COMMUNITY)
Admission: EM | Admit: 2021-05-17 | Discharge: 2021-05-17 | Disposition: A | Discharge status: Home / Self Care | Payer: BC Managed Care – PPO | Attending: Emergency Medicine | Admitting: Emergency Medicine

## 2021-05-17 ENCOUNTER — Other Ambulatory Visit: Payer: Self-pay

## 2021-05-17 ENCOUNTER — Encounter (HOSPITAL_COMMUNITY): Payer: Self-pay

## 2021-05-17 DIAGNOSIS — L739 Follicular disorder, unspecified: Secondary | ICD-10-CM

## 2021-05-17 MED ORDER — CEPHALEXIN 500 MG PO CAPS: 500.0000 mg | ORAL_CAPSULE | Freq: Two times a day (BID) | ORAL | 0 refills | Status: AC

## 2021-05-17 NOTE — ED Triage Notes (Signed)
Pt presents with itchy scalp X 1 week.   States he has white bumps on his scalp.

## 2021-05-17 NOTE — Discharge Instructions (Addendum)
Take the Keflex twice a day for the next 7 days.    You can also take Ibuprofen and/or Tylenol as needed for pain relief and fever reduction.    Return or go to the Emergency Department if symptoms worsen or do not improve in the next few days.

## 2021-05-17 NOTE — ED Provider Notes (Signed)
[facility]    CSN: [id number] Arrival date & time: 05/17/21  1934      History   Chief Complaint Chief Complaint  Patient presents with   scalp irritation    HPI Miguel Wells is a 40 y.o. male.   Patient here for evaluation of white bumps on scalps for the past week.  Reports using a new hair oil but didn't realize he was supposed to rinse it after 5-10 minutes and left it on his head.  Reports bumps have been getting worse and are painful.  Reports using regular shampoo and antibacterial lotion with no relief.  Denies any trauma, injury, or other precipitating event.  Denies any specific alleviating or aggravating factors.  Denies any fevers, chest pain, shortness of breath, N/V/D, numbness, tingling, weakness, abdominal pain, or headaches.     The history is provided by the patient.   History reviewed. No pertinent past medical history.  There are no problems to display for this patient.   History reviewed. No pertinent surgical history.     Home Medications    Prior to Admission medications   Medication Sig Start Date End Date Taking? Authorizing Provider  cephALEXin (KEFLEX) 500 MG capsule Take 1 capsule (500 mg total) by mouth 2 (two) times daily for 7 days. 05/17/21 05/24/21 Yes Ivette Loyal, NP  famotidine (PEPCID) 20 MG tablet Take 1 tablet (20 mg total) by mouth 2 (two) times daily. 04/15/15   Donnetta Hutching, MD  meloxicam (MOBIC) 7.5 MG tablet Take 1 tablet (7.5 mg total) by mouth daily. 05/20/18   Dahlia Byes A, NP  promethazine (PHENERGAN) 25 MG tablet Take 1 tablet (25 mg total) by mouth every 6 (six) hours as needed for nausea or vomiting. 04/15/15   Donnetta Hutching, MD  tobramycin-dexamethasone [facility]) ophthalmic solution Place 1 drop into both eyes every 4 (four) hours while awake. 08/10/16   Elvina Sidle, MD    Family History History reviewed. No pertinent family history.  Social History Social History   Tobacco Use   Smoking status: Never    Smokeless tobacco: Never  Substance Use Topics   Alcohol use: No   Drug use: No     Allergies   Patient has no known allergies.   Review of Systems Review of Systems  Skin:  Positive for rash.  All other systems reviewed and are negative.   Physical Exam Triage Vital Signs ED Triage Vitals  Enc Vitals Group     BP 05/17/21 2001 (!) 155/105     Pulse Rate 05/17/21 2001 77     Resp 05/17/21 2001 20     Temp 05/17/21 2001 99 F (37.2 C)     Temp Source 05/17/21 2001 Oral     SpO2 05/17/21 2001 100 %     Weight --      Height --      Head Circumference --      Peak Flow --      Pain Score 05/17/21 1959 0     Pain Loc --      Pain Edu? --      Excl. in GC? --    No data found.  Updated Vital Signs BP (!) 155/105 (BP Location: Right Arm)   Pulse 77   Temp 99 F (37.2 C) (Oral)   Resp 20   SpO2 100%   Visual Acuity Right Eye Distance:   Left Eye Distance:   Bilateral Distance:    Right Eye Near:  Left Eye Near:    Bilateral Near:     Physical Exam Vitals and nursing note reviewed.  Constitutional:      General: He is not in acute distress.    Appearance: Normal appearance. He is not ill-appearing, toxic-appearing or diaphoretic.  HENT:     Head: Normocephalic and atraumatic.     Comments: Multiple pustules noted to base of scalp Eyes:     Conjunctiva/sclera: Conjunctivae normal.  Cardiovascular:     Rate and Rhythm: Normal rate.     Pulses: Normal pulses.  Pulmonary:     Effort: Pulmonary effort is normal.  Abdominal:     General: Abdomen is flat.  Musculoskeletal:        General: Normal range of motion.     Cervical back: Normal range of motion.  Skin:    General: Skin is warm and dry.  Neurological:     General: No focal deficit present.     Mental Status: He is alert and oriented to person, place, and time.  Psychiatric:        Mood and Affect: Mood normal.     UC Treatments / Results  Labs (all labs ordered are listed, but only  abnormal results are displayed) Labs Reviewed - No data to display  EKG   Radiology No results found.  Procedures Procedures (including critical care time)  Medications Ordered in UC Medications - No data to display  Initial Impression / Assessment and Plan / UC Course  I have reviewed the triage vital signs and the nursing notes.  Pertinent labs & imaging results that were available during my care of the patient were reviewed by me and considered in my medical decision making (see chart for details).    Assessment negative for red flags or concerns.  Likely folliculitis.  Will treat with keflex twice a day for the next 7 days.  May take Tylenol and/or Ibuprofen as needed for pain.  Follow up as needed.  Final Clinical Impressions(s) / UC Diagnoses   Final diagnoses:  Folliculitis     Discharge Instructions      Take the Keflex twice a day for the next 7 days.    You can also take Ibuprofen and/or Tylenol as needed for pain relief and fever reduction.    Return or go to the Emergency Department if symptoms worsen or do not improve in the next few days.        ED Prescriptions     Medication Sig Dispense Auth. Provider   cephALEXin (KEFLEX) 500 MG capsule Take 1 capsule (500 mg total) by mouth 2 (two) times daily for 7 days. 14 capsule Ivette Loyal, NP      PDMP not reviewed this encounter.   Ivette Loyal, NP 05/17/21 2039

## 2021-06-20 ENCOUNTER — Ambulatory Visit (HOSPITAL_COMMUNITY)
Admission: EM | Admit: 2021-06-20 | Discharge: 2021-06-20 | Disposition: A | Discharge status: Home / Self Care | Payer: BC Managed Care – PPO | Attending: Emergency Medicine | Admitting: Emergency Medicine

## 2021-06-20 ENCOUNTER — Encounter (HOSPITAL_COMMUNITY): Payer: Self-pay | Admitting: *Deleted

## 2021-06-20 ENCOUNTER — Other Ambulatory Visit: Payer: Self-pay

## 2021-06-20 DIAGNOSIS — L237 Allergic contact dermatitis due to plants, except food: Secondary | ICD-10-CM | POA: Diagnosis not present

## 2021-06-20 MED ORDER — METHYLPREDNISOLONE SODIUM SUCC 125 MG IJ SOLR
125.0000 mg | Freq: Once | INTRAMUSCULAR | Status: AC
  Administered 2021-06-20: 125 mg via INTRAMUSCULAR

## 2021-06-20 MED ORDER — PREDNISONE 10 MG (21) PO TBPK: ORAL_TABLET | Freq: Every day | ORAL | 0 refills | Status: AC

## 2021-06-20 MED ORDER — METHYLPREDNISOLONE SODIUM SUCC 125 MG IJ SOLR
INTRAMUSCULAR | Status: AC
  Filled 2021-06-20: qty 2

## 2021-06-20 NOTE — ED Provider Notes (Signed)
[facility]    CSN: [id number] Arrival date & time: 06/20/21  1114      History   Chief Complaint Chief Complaint  Patient presents with   Poison Ivy    HPI Miguel Wells is a 40 y.o. male.   Patient here for evaluation of possible poison ivy to bilateral upper extremities.  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  Reports rash is itchy.  Reports using calamine lotion and hydrocortisone with minimal symptom relief.  Denies any trauma, injury, or other precipitating event.  Denies any specific alleviating or aggravating factors.  Denies any fevers, chest pain, shortness of breath, N/V/D, numbness, tingling, weakness, abdominal pain, or headaches.    The history is provided by the patient.  Poison Ivy   History reviewed. No pertinent past medical history.  There are no problems to display for this patient.   History reviewed. No pertinent surgical history.     Home Medications    Prior to Admission medications   Medication Sig Start Date End Date Taking? Authorizing Provider  predniSONE (STERAPRED UNI-PAK 21 TAB) 10 MG (21) TBPK tablet Take by mouth daily. Take 6 tabs by mouth daily  for 2 days, then 5 tabs for 2 days, then 4 tabs for 2 days, then 3 tabs for 2 days, 2 tabs for 2 days, then 1 tab by mouth daily for 2 days 06/20/21  Yes Ivette Loyal, NP  famotidine (PEPCID) 20 MG tablet Take 1 tablet (20 mg total) by mouth 2 (two) times daily. 04/15/15   Donnetta Hutching, MD  meloxicam (MOBIC) 7.5 MG tablet Take 1 tablet (7.5 mg total) by mouth daily. 05/20/18   Dahlia Byes A, NP  promethazine (PHENERGAN) 25 MG tablet Take 1 tablet (25 mg total) by mouth every 6 (six) hours as needed for nausea or vomiting. 04/15/15   Donnetta Hutching, MD  tobramycin-dexamethasone [facility]) ophthalmic solution Place 1 drop into both eyes every 4 (four) hours while awake. 08/10/16   Elvina Sidle, MD    Family History History reviewed. No pertinent family  history.  Social History Social History   Tobacco Use   Smoking status: Never   Smokeless tobacco: Never  Substance Use Topics   Alcohol use: No   Drug use: No     Allergies   Patient has no known allergies.   Review of Systems Review of Systems  Skin:  Positive for rash.  All other systems reviewed and are negative.   Physical Exam Triage Vital Signs ED Triage Vitals [06/20/21 1240]  Enc Vitals Group     BP (!) 142/98     Pulse Rate 71     Resp 18     Temp 98.7 F (37.1 C)     Temp src      SpO2 99 %     Weight      Height      Head Circumference      Peak Flow      Pain Score 0     Pain Loc      Pain Edu?      Excl. in GC?    No data found.  Updated Vital Signs BP (!) 142/98   Pulse 71   Temp 98.7 F (37.1 C)   Resp 18   SpO2 99%   Visual Acuity Right Eye Distance:   Left Eye Distance:   Bilateral Distance:    Right Eye Near:   Left Eye Near:  Bilateral Near:     Physical Exam Vitals and nursing note reviewed.  Constitutional:      General: He is not in acute distress.    Appearance: Normal appearance. He is not ill-appearing, toxic-appearing or diaphoretic.  HENT:     Head: Normocephalic and atraumatic.  Eyes:     Conjunctiva/sclera: Conjunctivae normal.  Cardiovascular:     Rate and Rhythm: Normal rate.     Pulses: Normal pulses.  Pulmonary:     Effort: Pulmonary effort is normal.  Abdominal:     General: Abdomen is flat.  Musculoskeletal:        General: Normal range of motion.     Cervical back: Normal range of motion.  Skin:    General: Skin is warm and dry.     Findings: Rash (see photo below) present.  Neurological:     General: No focal deficit present.     Mental Status: He is alert and oriented to person, place, and time.  Psychiatric:        Mood and Affect: Mood normal.        UC Treatments / Results  Labs (all labs ordered are listed, but only abnormal results are displayed) Labs Reviewed - No data to  display  EKG   Radiology No results found.  Procedures Procedures (including critical care time)  Medications Ordered in UC Medications  methylPREDNISolone sodium succinate (SOLU-MEDROL) 125 mg/2 mL injection 125 mg (125 mg Intramuscular Given 06/20/21 1313)    Initial Impression / Assessment and Plan / UC Course  I have reviewed the triage vital signs and the nursing notes.  Pertinent labs & imaging results that were available during my care of the patient were reviewed by me and considered in my medical decision making (see chart for details).    Poison ivy dermatitis.  Assessment negative for red flags or concerns.  Solu-Medrol IM given in office.  Prednisone taper prescribed to start tomorrow.  May continue to use calamine lotion and hydrocortisone as needed for itching.  Discussed conservative symptom management as described in discharge instructions.  Follow-up as needed Final Clinical Impressions(s) / UC Diagnoses   Final diagnoses:  Poison ivy dermatitis     Discharge Instructions      Take prednisone as prescribed.  You can continue to use calamine lotion and hydrocortisone lotion as needed for itching. Apply a cool compress to the affected areas or take baths in cool water. This will help with itching. Avoid hot baths and showers. Take oatmeal baths as needed.  Wash clothes immediately after wearing them if the rash is oozing.    Return or go to the Emergency Department if symptoms worsen or do not improve in the next few days.      ED Prescriptions     Medication Sig Dispense Auth. Provider   predniSONE (STERAPRED UNI-PAK 21 TAB) 10 MG (21) TBPK tablet Take by mouth daily. Take 6 tabs by mouth daily  for 2 days, then 5 tabs for 2 days, then 4 tabs for 2 days, then 3 tabs for 2 days, 2 tabs for 2 days, then 1 tab by mouth daily for 2 days 42 tablet Ivette Loyal, NP      PDMP not reviewed this encounter.   Ivette Loyal, NP 06/20/21 [phone]

## 2021-06-20 NOTE — ED Triage Notes (Signed)
Pt reports he works for [employer]  and is out side and thinks he has poison ivy.

## 2021-06-20 NOTE — Discharge Instructions (Addendum)
Take prednisone as prescribed.  You can continue to use calamine lotion and hydrocortisone lotion as needed for itching. Apply a cool compress to the affected areas or take baths in cool water. This will help with itching. Avoid hot baths and showers. Take oatmeal baths as needed.  Wash clothes immediately after wearing them if the rash is oozing.    Return or go to the Emergency Department if symptoms worsen or do not improve in the next few days.
